# Patient Record
Sex: Male | Born: 1950
Health system: Southern US, Community
[De-identification: ages and names within clinical notes are randomized; demographics above are authoritative.]

---

## 1999-08-30 ENCOUNTER — Encounter: Payer: Self-pay | Admitting: *Deleted

## 1999-08-30 ENCOUNTER — Encounter: Admission: RE | Admit: 1999-08-30 | Discharge: 1999-08-30 | Payer: Self-pay | Admitting: *Deleted

## 2003-10-03 ENCOUNTER — Encounter: Admission: RE | Admit: 2003-10-03 | Discharge: 2003-10-03 | Payer: Self-pay | Admitting: Internal Medicine

## 2005-08-03 IMAGING — CT CT ABDOMEN W/ CM
1 series · 16 of 32 positions shown, 20 images · IV contrast (GASTROGRAFIN & [ID] OMNI 300)
Comparison: none

CLINICAL DATA: Right upper quadrant pain. 
 CT SCAN OF THE ABDOMEN WITH CONTRAST
TECHNIQUE: Multidetector helical imaging carried out through the upper abdomen utilizing oral and IV contrast (100 cc Omnipaque 300).  No prior studies for comparison. 
 CT ABDOMEN WITH CONTRAST
 The lung bases are clear.  On image number 7, there is a 5 mm lucency in the central liver.  No other lesions are identified.  This is highly likely to be a small cyst.  It is unlikely to be clinically significant.  However, it is too small to accurately measure Hounsfield units.  The spleen, pancreas, adrenals, and kidneys are normal.  No adenopathy or ascites.  No mesenteric masses or bowel lesions evident.  No calcified gallstones or bile duct dilatation. 
 IMPRESSION
 Normal except for a subcentimeter lucency in the central lucency in the central liver ? likely simple cyst.  Unlikely to be clinically significant.

[Series 2: — · axial · 0.68mm/px · z∈[-265,-15]mm · 16 of 88 slices shown, 20 images]
[im 6/88  soft-tissue]
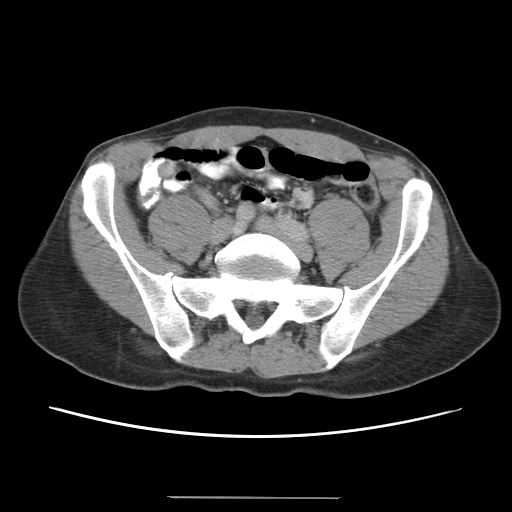
[im 6/88  bone]
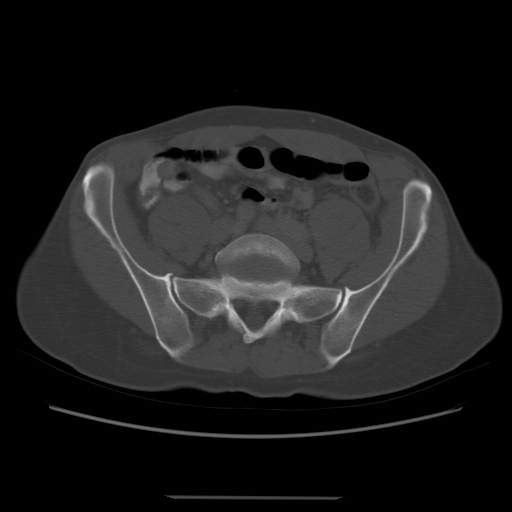
[im 12/88  soft-tissue]
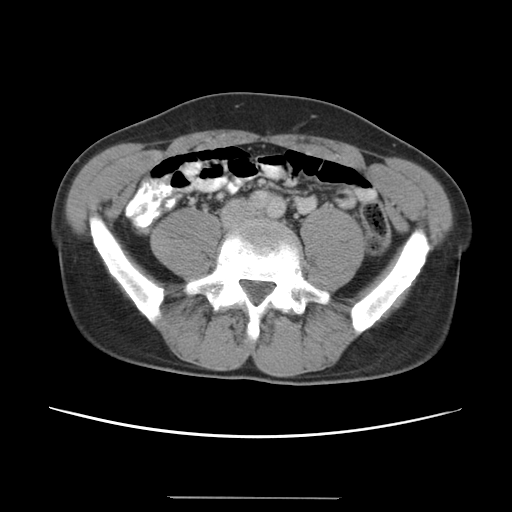
[im 17/88  soft-tissue]
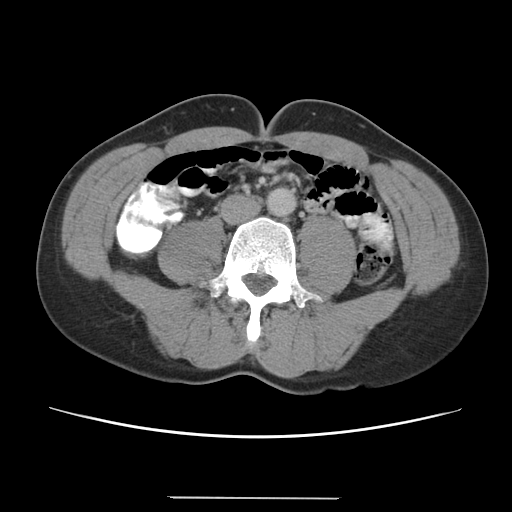
[im 23/88  soft-tissue]
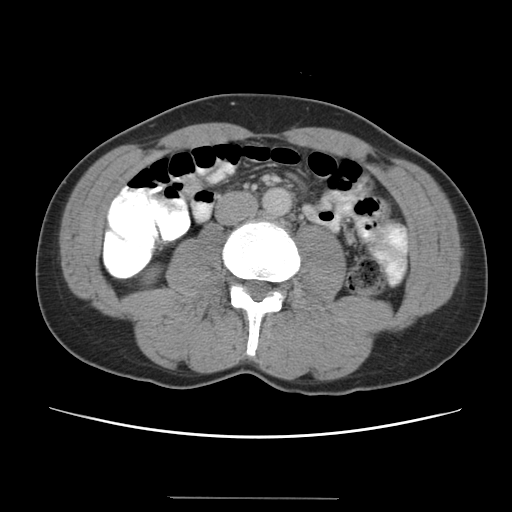
[im 29/88  soft-tissue]
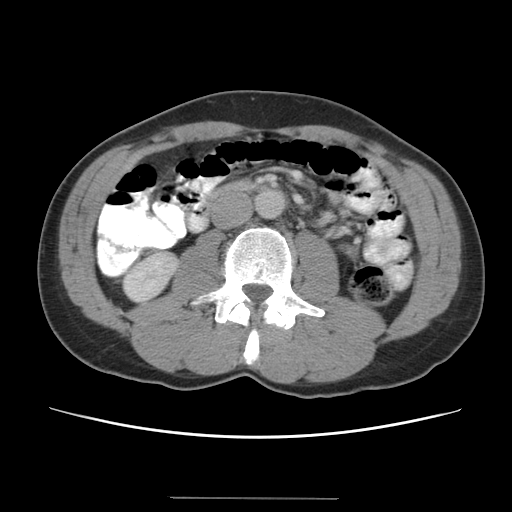
[im 34/88  soft-tissue]
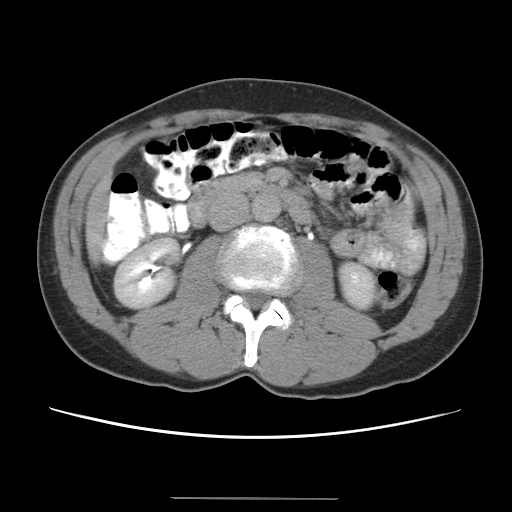
[im 40/88  soft-tissue]
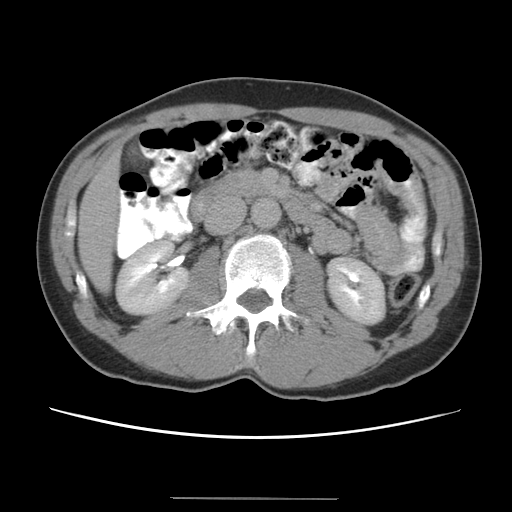
[im 48/88  soft-tissue]
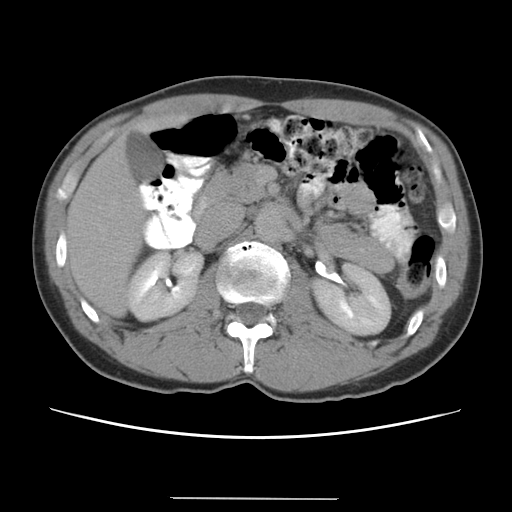
[im 54/88  soft-tissue]
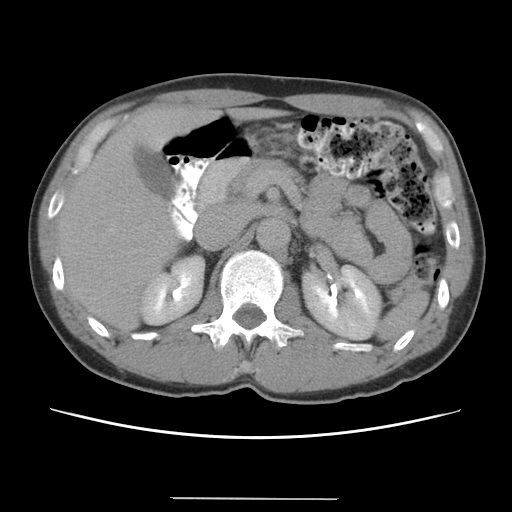
[im 54/88  bone]
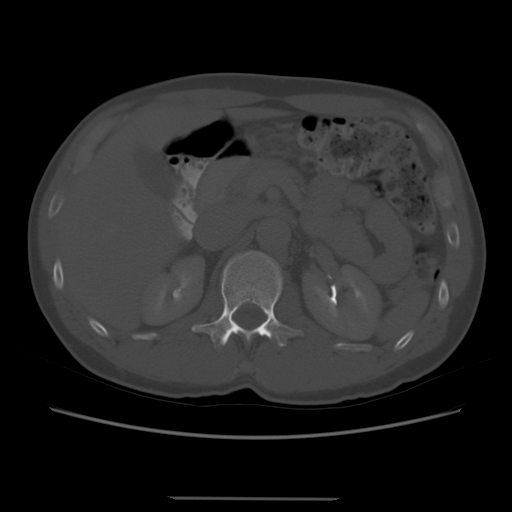
[im 59/88  soft-tissue]
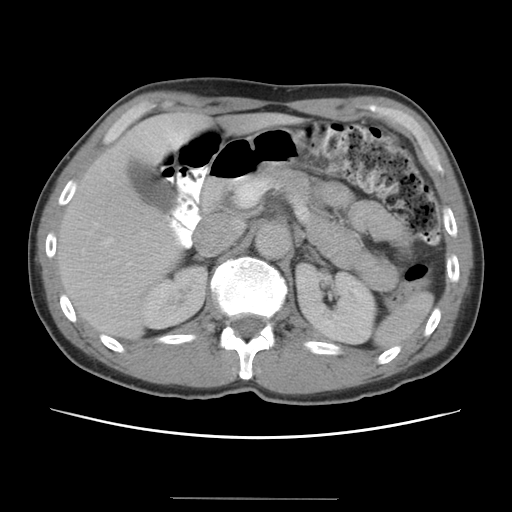
[im 65/88  soft-tissue]
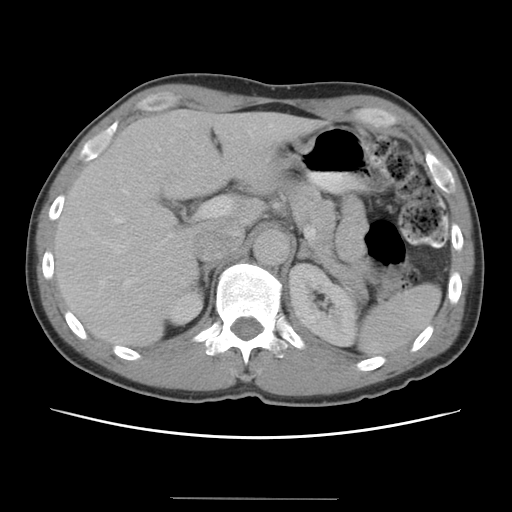
[im 71/88  soft-tissue]
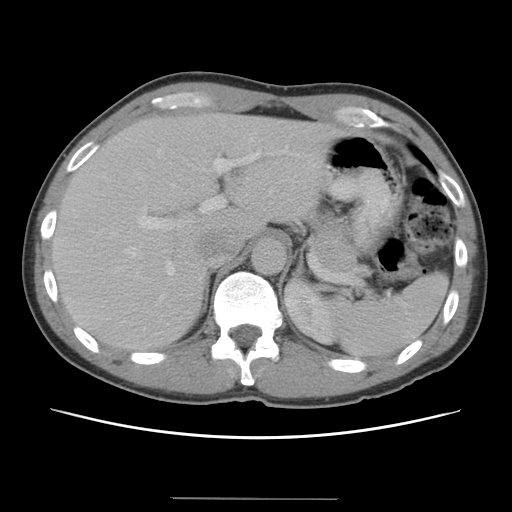
[im 76/88  soft-tissue]
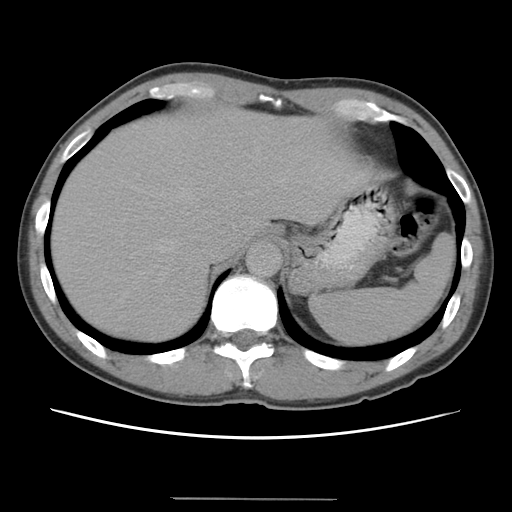
[im 76/88  lung]
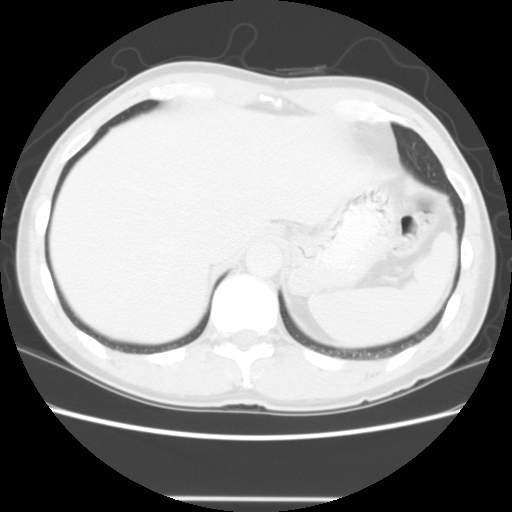
[im 79/88  lung]
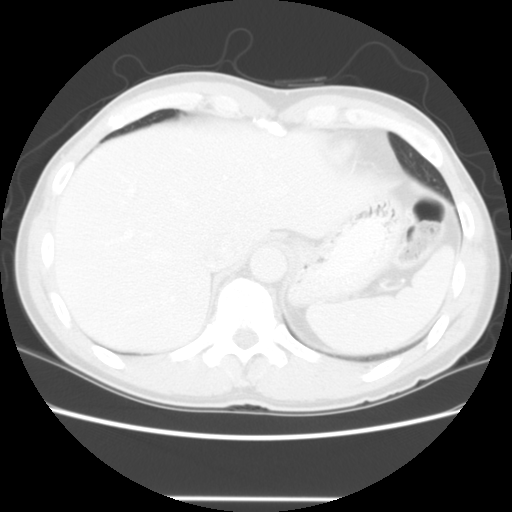
[im 82/88  soft-tissue]
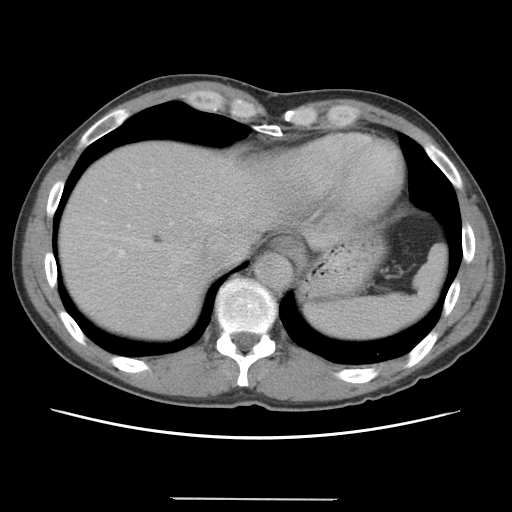
[im 82/88  lung]
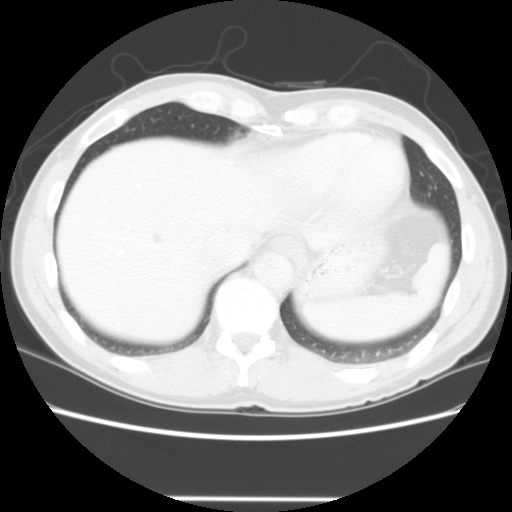
[im 85/88  lung]
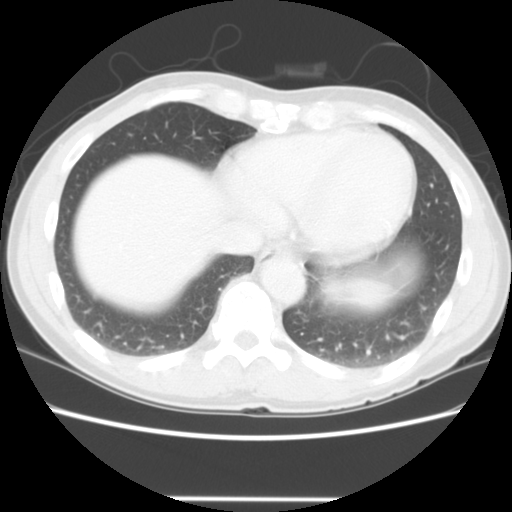

[16 of 32 positions shown; findings below may reference images not displayed]

## 2015-10-25 DIAGNOSIS — C4491 Basal cell carcinoma of skin, unspecified: Secondary | ICD-10-CM

## 2015-10-25 HISTORY — DX: Basal cell carcinoma of skin, unspecified: C44.91

## 2019-03-19 DIAGNOSIS — Z0000 Encounter for general adult medical examination without abnormal findings: Secondary | ICD-10-CM | POA: Diagnosis not present

## 2019-03-19 DIAGNOSIS — Z1389 Encounter for screening for other disorder: Secondary | ICD-10-CM | POA: Diagnosis not present

## 2019-08-29 ENCOUNTER — Ambulatory Visit: Payer: PPO

## 2019-09-04 ENCOUNTER — Ambulatory Visit: Payer: PPO | Attending: Internal Medicine

## 2019-09-04 DIAGNOSIS — Z23 Encounter for immunization: Secondary | ICD-10-CM

## 2019-09-04 NOTE — Progress Notes (Signed)
   Covid-19 Vaccination Clinic  Name:  Jorge Nash    MRN: [id number] DOB: 02-25-1951  09/04/2019  Mr. Jorge Nash was observed post Covid-19 immunization for 15 minutes without incidence. He was provided with Vaccine Information Sheet and instruction to access the V-Safe system.   Mr. Jorge Nash was instructed to call 911 with any severe reactions post vaccine: Marland Kitchen Difficulty breathing  . Swelling of your face and throat  . A fast heartbeat  . A bad rash all over your body  . Dizziness and weakness    Immunizations Administered    Name Date Dose VIS Date Route   Pfizer COVID-19 Vaccine 09/04/2019 11:11 AM 0.3 mL 07/09/2019 Intramuscular   Manufacturer: [place]   Lot: [id number]   [place]: [id number]

## 2019-09-19 ENCOUNTER — Ambulatory Visit: Payer: PPO

## 2019-09-21 ENCOUNTER — Ambulatory Visit: Payer: PPO

## 2019-09-28 ENCOUNTER — Ambulatory Visit: Payer: PPO | Attending: Internal Medicine

## 2019-09-28 DIAGNOSIS — Z23 Encounter for immunization: Secondary | ICD-10-CM

## 2019-09-28 NOTE — Progress Notes (Signed)
   Covid-19 Vaccination Clinic  Name:  Jorge Nash    MRN: [id number] DOB: 1950-11-13  09/28/2019  Mr. Jorge Nash was observed post Covid-19 immunization for 15 minutes without incident. He was provided with Vaccine Information Sheet and instruction to access the V-Safe system.   Mr. Jorge Nash was instructed to call 911 with any severe reactions post vaccine: Marland Kitchen Difficulty breathing  . Swelling of face and throat  . A fast heartbeat  . A bad rash all over body  . Dizziness and weakness   Immunizations Administered    Name Date Dose VIS Date Route   Pfizer COVID-19 Vaccine 09/28/2019  8:39 AM 0.3 mL 07/09/2019 Intramuscular   Manufacturer: [place]   Lot: [id number]   [place]: [id number]

## 2020-03-24 DIAGNOSIS — Z0000 Encounter for general adult medical examination without abnormal findings: Secondary | ICD-10-CM | POA: Diagnosis not present

## 2020-03-24 DIAGNOSIS — Z1389 Encounter for screening for other disorder: Secondary | ICD-10-CM | POA: Diagnosis not present

## 2020-03-24 DIAGNOSIS — Z125 Encounter for screening for malignant neoplasm of prostate: Secondary | ICD-10-CM | POA: Diagnosis not present

## 2020-03-24 DIAGNOSIS — Z136 Encounter for screening for cardiovascular disorders: Secondary | ICD-10-CM | POA: Diagnosis not present

## 2020-03-24 DIAGNOSIS — Z1159 Encounter for screening for other viral diseases: Secondary | ICD-10-CM | POA: Diagnosis not present

## 2020-04-17 ENCOUNTER — Other Ambulatory Visit: Payer: Self-pay

## 2020-04-17 ENCOUNTER — Ambulatory Visit: Payer: PPO | Admitting: Dermatology

## 2020-04-17 ENCOUNTER — Encounter: Payer: Self-pay | Admitting: Dermatology

## 2020-04-17 DIAGNOSIS — Z1283 Encounter for screening for malignant neoplasm of skin: Secondary | ICD-10-CM | POA: Diagnosis not present

## 2020-04-17 DIAGNOSIS — L816 Other disorders of diminished melanin formation: Secondary | ICD-10-CM

## 2020-04-17 DIAGNOSIS — L821 Other seborrheic keratosis: Secondary | ICD-10-CM

## 2020-04-17 DIAGNOSIS — D225 Melanocytic nevi of trunk: Secondary | ICD-10-CM | POA: Diagnosis not present

## 2020-04-17 DIAGNOSIS — Z85828 Personal history of other malignant neoplasm of skin: Secondary | ICD-10-CM

## 2020-04-17 DIAGNOSIS — D229 Melanocytic nevi, unspecified: Secondary | ICD-10-CM

## 2020-04-17 NOTE — Progress Notes (Signed)
   Follow-Up Visit   Subjective  Jorge Nash is a 69 y.o. male who presents for the following: Annual Exam (no new concerns).  General skin check Location:  Duration:  Quality:  Associated Signs/Symptoms: Modifying Factors:  Severity:  Timing: Context: History of skin cancer right cheek  Objective  Well appearing patient in no apparent distress; mood and affect are within normal limits.  A full examination was performed including scalp, head, eyes, ears, nose, lips, neck, chest, axillae, abdomen, back, buttocks, bilateral upper extremities, bilateral lower extremities, hands, feet, fingers, toes, fingernails, and toenails. All findings within normal limits unless otherwise noted below.   Assessment & Plan    Personal history of skin cancer Head - Anterior (Face)  Self examine skin twice annually, dermatologist examination every 1 to 2 years.  Nevus Mid Back  Self exam twice annually.  Seborrheic keratosis Mid Back  Leave if stable.  First follow-up in 2 years for Jorge Nash date of birth June 03, 1951.  All skin from lower legs to scalp examined.  There is no recurrent skin cancer on his face.  There is no new skin cancer.  There is no atypical mole or melanoma.  He is prone to grow cherry angiomas which are mostly 2 mm red dots but one on the right upper chest is a darker purple, but also quite benign.  On the middle of the back is a textured tan 5 mm flat topped bump called a seborrheic keratosis which requires no attention.  On the right upper back arm and the right calf are a dozen 2 mm white macules called guttate hypomelanosis which are benign.  Historically he has had some brown spots come and go which are not present today and do not require any attention.  On one thumb he has a history of a possible blistering which could be a small pseudomyxoid cyst but today there is nothing on examination.  He should self examine twice annually.  I complemented him on his  fitness and he told me his cholesterol has come down precipitously which is wonderful.  If there is anything in less than 2 years that needs attention I would be delighted to see Jorge Nash earlier.  Skin cancer screening performed today.  I, Lavonna Monarch, MD, have reviewed all documentation for this visit.  The documentation on 04/17/20 for the exam, diagnosis, procedures, and orders are all accurate and complete.

## 2020-04-17 NOTE — Patient Instructions (Addendum)
First follow-up in 2 years for [name] date of birth 04-Jul-1951.  All skin from lower legs to scalp examined.  There is no recurrent skin cancer on his face.  There is no new skin cancer.  There is no atypical mole or melanoma.  He is prone to grow cherry angiomas which are mostly 2 mm red dots but one on the right upper chest is a darker purple, but also quite benign.  On the middle of the back is a textured tan 5 mm flat topped bump called a seborrheic keratosis which requires no attention.  On the right upper back arm and the right calf are a dozen 2 mm white macules called guttate hypomelanosis which are benign.  Historically he has had some brown spots come and go which are not present today and do not require any attention.  On one thumb he has a history of a possible blistering which could be a small pseudomyxoid cyst but today there is nothing on examination.  He should self examine twice annually.  I complemented him on his fitness and he told me his cholesterol has come down precipitously which is wonderful.  If there is anything in less than 2 years that needs attention I would be delighted to see [name] earlier.

## 2021-03-29 DIAGNOSIS — Z1389 Encounter for screening for other disorder: Secondary | ICD-10-CM | POA: Diagnosis not present

## 2021-03-29 DIAGNOSIS — R030 Elevated blood-pressure reading, without diagnosis of hypertension: Secondary | ICD-10-CM | POA: Diagnosis not present

## 2021-03-29 DIAGNOSIS — Z0000 Encounter for general adult medical examination without abnormal findings: Secondary | ICD-10-CM | POA: Diagnosis not present

## 2021-07-20 DIAGNOSIS — R030 Elevated blood-pressure reading, without diagnosis of hypertension: Secondary | ICD-10-CM | POA: Diagnosis not present

## 2022-03-08 DIAGNOSIS — M545 Low back pain, unspecified: Secondary | ICD-10-CM | POA: Diagnosis not present

## 2022-03-08 DIAGNOSIS — M25551 Pain in right hip: Secondary | ICD-10-CM | POA: Diagnosis not present

## 2022-03-08 DIAGNOSIS — M25552 Pain in left hip: Secondary | ICD-10-CM | POA: Diagnosis not present

## 2022-03-11 DIAGNOSIS — M6281 Muscle weakness (generalized): Secondary | ICD-10-CM | POA: Diagnosis not present

## 2022-03-11 DIAGNOSIS — M47816 Spondylosis without myelopathy or radiculopathy, lumbar region: Secondary | ICD-10-CM | POA: Diagnosis not present

## 2022-03-18 DIAGNOSIS — M1990 Unspecified osteoarthritis, unspecified site: Secondary | ICD-10-CM | POA: Diagnosis not present

## 2022-03-18 DIAGNOSIS — G8929 Other chronic pain: Secondary | ICD-10-CM | POA: Diagnosis not present

## 2022-03-18 DIAGNOSIS — L719 Rosacea, unspecified: Secondary | ICD-10-CM | POA: Diagnosis not present

## 2022-03-18 DIAGNOSIS — Z7982 Long term (current) use of aspirin: Secondary | ICD-10-CM | POA: Diagnosis not present

## 2022-03-18 DIAGNOSIS — G589 Mononeuropathy, unspecified: Secondary | ICD-10-CM | POA: Diagnosis not present

## 2022-03-20 DIAGNOSIS — M47816 Spondylosis without myelopathy or radiculopathy, lumbar region: Secondary | ICD-10-CM | POA: Diagnosis not present

## 2022-03-20 DIAGNOSIS — M6281 Muscle weakness (generalized): Secondary | ICD-10-CM | POA: Diagnosis not present

## 2022-03-27 DIAGNOSIS — M47816 Spondylosis without myelopathy or radiculopathy, lumbar region: Secondary | ICD-10-CM | POA: Diagnosis not present

## 2022-03-27 DIAGNOSIS — M6281 Muscle weakness (generalized): Secondary | ICD-10-CM | POA: Diagnosis not present

## 2022-04-08 DIAGNOSIS — Z23 Encounter for immunization: Secondary | ICD-10-CM | POA: Diagnosis not present

## 2022-04-08 DIAGNOSIS — Z125 Encounter for screening for malignant neoplasm of prostate: Secondary | ICD-10-CM | POA: Diagnosis not present

## 2022-04-08 DIAGNOSIS — L719 Rosacea, unspecified: Secondary | ICD-10-CM | POA: Diagnosis not present

## 2022-04-08 DIAGNOSIS — R030 Elevated blood-pressure reading, without diagnosis of hypertension: Secondary | ICD-10-CM | POA: Diagnosis not present

## 2022-04-08 DIAGNOSIS — Z0000 Encounter for general adult medical examination without abnormal findings: Secondary | ICD-10-CM | POA: Diagnosis not present

## 2022-04-08 DIAGNOSIS — J302 Other seasonal allergic rhinitis: Secondary | ICD-10-CM | POA: Diagnosis not present

## 2022-04-08 DIAGNOSIS — Z136 Encounter for screening for cardiovascular disorders: Secondary | ICD-10-CM | POA: Diagnosis not present

## 2022-04-08 DIAGNOSIS — M5442 Lumbago with sciatica, left side: Secondary | ICD-10-CM | POA: Diagnosis not present

## 2022-04-08 DIAGNOSIS — Z1211 Encounter for screening for malignant neoplasm of colon: Secondary | ICD-10-CM | POA: Diagnosis not present

## 2022-04-09 DIAGNOSIS — M47816 Spondylosis without myelopathy or radiculopathy, lumbar region: Secondary | ICD-10-CM | POA: Diagnosis not present

## 2022-04-22 ENCOUNTER — Ambulatory Visit: Payer: PPO | Admitting: Dermatology

## 2022-04-23 ENCOUNTER — Ambulatory Visit: Payer: PPO | Admitting: Dermatology

## 2022-10-23 DIAGNOSIS — D225 Melanocytic nevi of trunk: Secondary | ICD-10-CM | POA: Diagnosis not present

## 2022-10-23 DIAGNOSIS — L718 Other rosacea: Secondary | ICD-10-CM | POA: Diagnosis not present

## 2022-10-23 DIAGNOSIS — L814 Other melanin hyperpigmentation: Secondary | ICD-10-CM | POA: Diagnosis not present

## 2022-10-23 DIAGNOSIS — Z08 Encounter for follow-up examination after completed treatment for malignant neoplasm: Secondary | ICD-10-CM | POA: Diagnosis not present

## 2022-10-23 DIAGNOSIS — Z85828 Personal history of other malignant neoplasm of skin: Secondary | ICD-10-CM | POA: Diagnosis not present

## 2022-10-23 DIAGNOSIS — L821 Other seborrheic keratosis: Secondary | ICD-10-CM | POA: Diagnosis not present

## 2023-04-23 DIAGNOSIS — L719 Rosacea, unspecified: Secondary | ICD-10-CM | POA: Diagnosis not present

## 2023-04-23 DIAGNOSIS — J302 Other seasonal allergic rhinitis: Secondary | ICD-10-CM | POA: Diagnosis not present

## 2023-04-23 DIAGNOSIS — Z125 Encounter for screening for malignant neoplasm of prostate: Secondary | ICD-10-CM | POA: Diagnosis not present

## 2023-04-23 DIAGNOSIS — Z0000 Encounter for general adult medical examination without abnormal findings: Secondary | ICD-10-CM | POA: Diagnosis not present

## 2023-04-23 DIAGNOSIS — M5442 Lumbago with sciatica, left side: Secondary | ICD-10-CM | POA: Diagnosis not present

## 2023-04-23 DIAGNOSIS — R030 Elevated blood-pressure reading, without diagnosis of hypertension: Secondary | ICD-10-CM | POA: Diagnosis not present

## 2023-04-23 DIAGNOSIS — R634 Abnormal weight loss: Secondary | ICD-10-CM | POA: Diagnosis not present

## 2023-04-23 DIAGNOSIS — Z79899 Other long term (current) drug therapy: Secondary | ICD-10-CM | POA: Diagnosis not present

## 2023-04-23 DIAGNOSIS — Z1211 Encounter for screening for malignant neoplasm of colon: Secondary | ICD-10-CM | POA: Diagnosis not present

## 2024-04-28 ENCOUNTER — Other Ambulatory Visit: Payer: Self-pay | Admitting: Medical Genetics

## 2024-05-11 DIAGNOSIS — R634 Abnormal weight loss: Secondary | ICD-10-CM | POA: Diagnosis not present

## 2024-05-11 DIAGNOSIS — Z0000 Encounter for general adult medical examination without abnormal findings: Secondary | ICD-10-CM | POA: Diagnosis not present

## 2024-05-11 DIAGNOSIS — Z23 Encounter for immunization: Secondary | ICD-10-CM | POA: Diagnosis not present

## 2024-05-11 DIAGNOSIS — L719 Rosacea, unspecified: Secondary | ICD-10-CM | POA: Diagnosis not present

## 2024-05-11 DIAGNOSIS — Z125 Encounter for screening for malignant neoplasm of prostate: Secondary | ICD-10-CM | POA: Diagnosis not present

## 2024-05-11 DIAGNOSIS — Z1211 Encounter for screening for malignant neoplasm of colon: Secondary | ICD-10-CM | POA: Diagnosis not present

## 2024-05-11 DIAGNOSIS — J302 Other seasonal allergic rhinitis: Secondary | ICD-10-CM | POA: Diagnosis not present

## 2024-05-11 DIAGNOSIS — I10 Essential (primary) hypertension: Secondary | ICD-10-CM | POA: Diagnosis not present

## 2024-05-11 DIAGNOSIS — Z79899 Other long term (current) drug therapy: Secondary | ICD-10-CM | POA: Diagnosis not present

## 2024-05-11 DIAGNOSIS — Z136 Encounter for screening for cardiovascular disorders: Secondary | ICD-10-CM | POA: Diagnosis not present

## 2024-05-11 DIAGNOSIS — M25511 Pain in right shoulder: Secondary | ICD-10-CM | POA: Diagnosis not present

## 2024-05-12 ENCOUNTER — Other Ambulatory Visit (HOSPITAL_BASED_OUTPATIENT_CLINIC_OR_DEPARTMENT_OTHER): Payer: Self-pay | Admitting: Internal Medicine

## 2024-05-12 DIAGNOSIS — E7800 Pure hypercholesterolemia, unspecified: Secondary | ICD-10-CM

## 2024-05-26 ENCOUNTER — Ambulatory Visit (HOSPITAL_BASED_OUTPATIENT_CLINIC_OR_DEPARTMENT_OTHER)
Admission: RE | Admit: 2024-05-26 | Discharge: 2024-05-26 | Disposition: A | Discharge status: Home / Self Care | Payer: Self-pay | Source: Ambulatory Visit | Attending: Internal Medicine | Admitting: Internal Medicine

## 2024-05-26 DIAGNOSIS — E7800 Pure hypercholesterolemia, unspecified: Secondary | ICD-10-CM | POA: Insufficient documentation

## 2024-06-02 DIAGNOSIS — R931 Abnormal findings on diagnostic imaging of heart and coronary circulation: Secondary | ICD-10-CM | POA: Diagnosis not present

## 2024-06-02 DIAGNOSIS — E785 Hyperlipidemia, unspecified: Secondary | ICD-10-CM | POA: Diagnosis not present

## 2024-06-02 DIAGNOSIS — M25511 Pain in right shoulder: Secondary | ICD-10-CM | POA: Diagnosis not present

## 2024-06-02 DIAGNOSIS — I10 Essential (primary) hypertension: Secondary | ICD-10-CM | POA: Diagnosis not present

## 2024-06-03 DIAGNOSIS — M25511 Pain in right shoulder: Secondary | ICD-10-CM | POA: Diagnosis not present

## 2024-06-08 ENCOUNTER — Other Ambulatory Visit

## 2024-06-08 DIAGNOSIS — Z006 Encounter for examination for normal comparison and control in clinical research program: Secondary | ICD-10-CM

## 2024-06-11 DIAGNOSIS — M25511 Pain in right shoulder: Secondary | ICD-10-CM | POA: Diagnosis not present

## 2024-06-18 DIAGNOSIS — M25511 Pain in right shoulder: Secondary | ICD-10-CM | POA: Diagnosis not present

## 2024-06-18 LAB — GENECONNECT MOLECULAR SCREEN: Genetic Analysis Overall Interpretation: NEGATIVE

## 2024-06-23 DIAGNOSIS — M25511 Pain in right shoulder: Secondary | ICD-10-CM | POA: Diagnosis not present

## 2024-06-30 DIAGNOSIS — E875 Hyperkalemia: Secondary | ICD-10-CM | POA: Diagnosis not present
